# Patient Record
Sex: Female | Born: 2000 | Race: White | Hispanic: No | Marital: Single | Smoking: Never smoker
Health system: Southern US, Community
[De-identification: ages and names within clinical notes are randomized; demographics above are authoritative.]

## PROBLEM LIST (undated history)

## (undated) DIAGNOSIS — F32A Depression, unspecified: Secondary | ICD-10-CM

## (undated) DIAGNOSIS — F419 Anxiety disorder, unspecified: Secondary | ICD-10-CM

## (undated) DIAGNOSIS — Z9109 Other allergy status, other than to drugs and biological substances: Secondary | ICD-10-CM

## (undated) DIAGNOSIS — F309 Manic episode, unspecified: Secondary | ICD-10-CM

## (undated) DIAGNOSIS — F329 Major depressive disorder, single episode, unspecified: Secondary | ICD-10-CM

## (undated) HISTORY — DX: Anxiety disorder, unspecified: F41.9

## (undated) HISTORY — DX: Manic episode, unspecified: F30.9

## (undated) HISTORY — DX: Other allergy status, other than to drugs and biological substances: Z91.09

## (undated) HISTORY — DX: Major depressive disorder, single episode, unspecified: F32.9

## (undated) HISTORY — DX: Depression, unspecified: F32.A

---
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
## 2019-02-02 IMAGING — CT CT CERVICAL SPINE W/O CM
5 of 8 series · 12 of 33 positions shown, 13 images · non-contrast
Comparison: None.

CLINICAL DATA: Fall, hit back of head neck pain

EXAM:
CT HEAD WITHOUT CONTRAST
CT CERVICAL SPINE WITHOUT CONTRAST
TECHNIQUE: Multidetector CT imaging of the head and cervical spine was
performed following the standard protocol without intravenous
contrast. Multiplanar CT image reconstructions of the cervical spine
were also generated.

[Series 4: head bone · axial · 0.44mm/px · z∈[+104,+156]mm · 2 of 78 slices shown]
[im 26/78  bone]
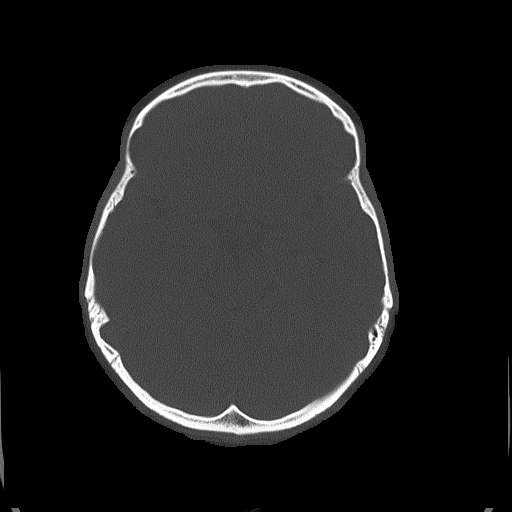
[im 52/78  bone]
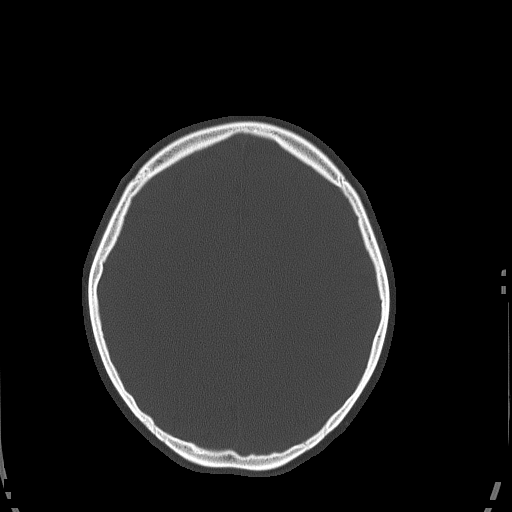

[Series 8: c spine soft · axial · 0.27mm/px · z∈[-44,+8]mm · 2 of 79 slices shown]
[im 27/79  soft-tissue]
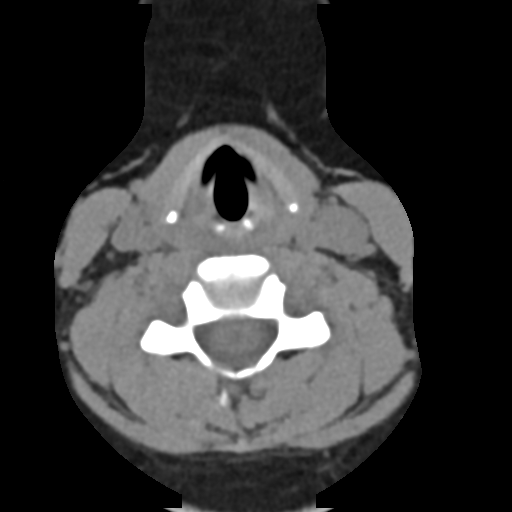
[im 53/79  soft-tissue]
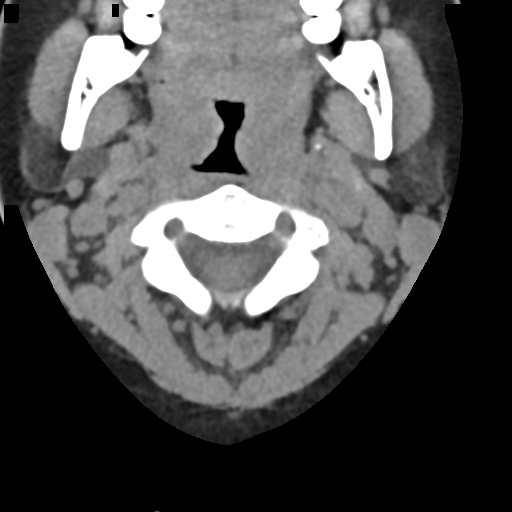

[Series 9: sagittal bone · sagittal · 0.23mm/px · 5 of 61 slices shown]
[im 11/61  bone]
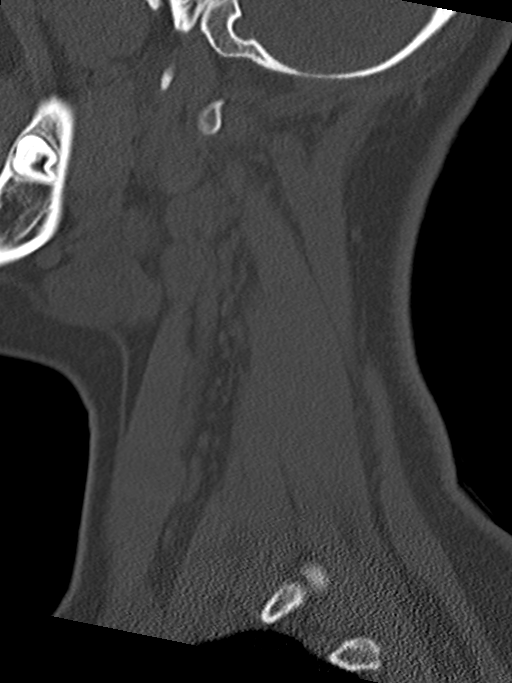
[im 21/61  bone]
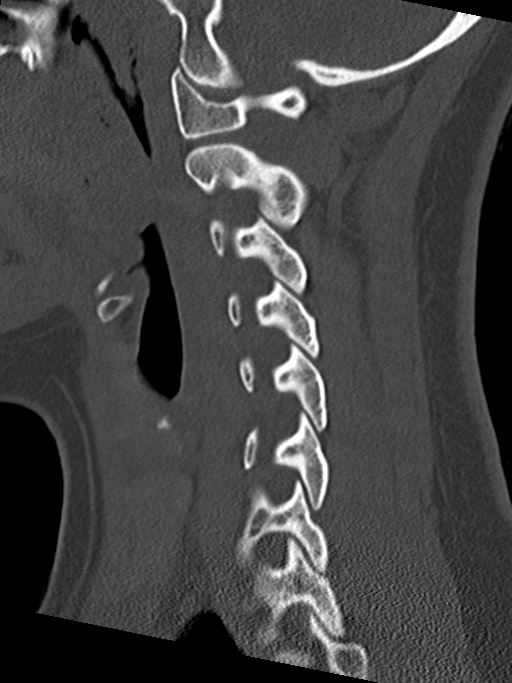
[im 31/61  bone]
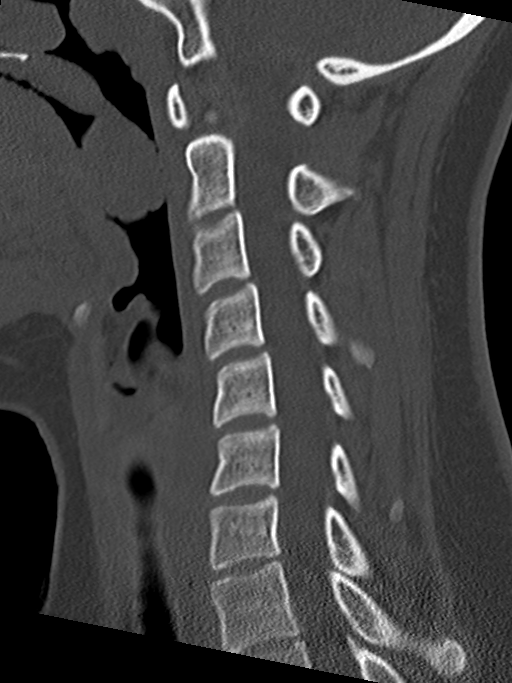
[im 41/61  bone]
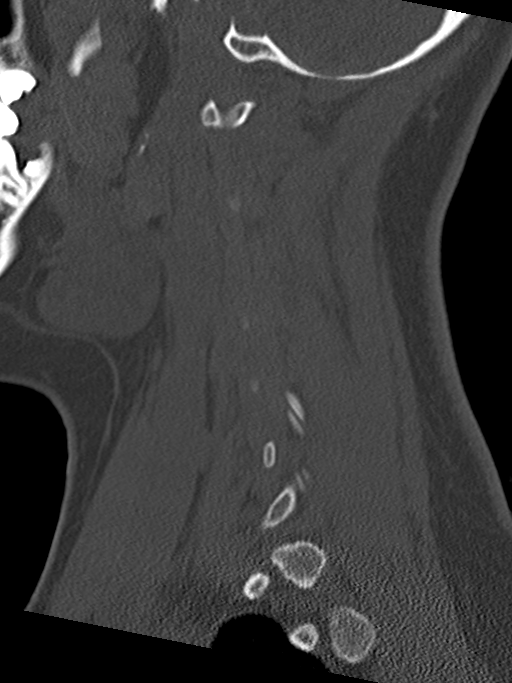
[im 51/61  bone]
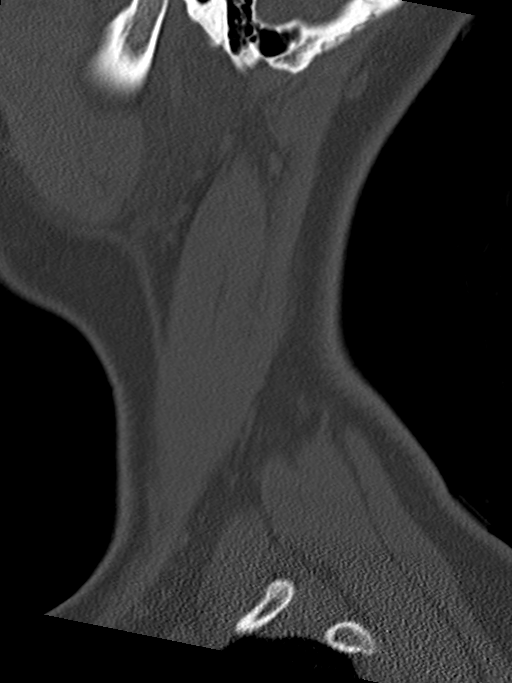

[Series 10: coronal bone · coronal · 0.23mm/px · 1 of 47 slices shown]
[im 24/47  bone]
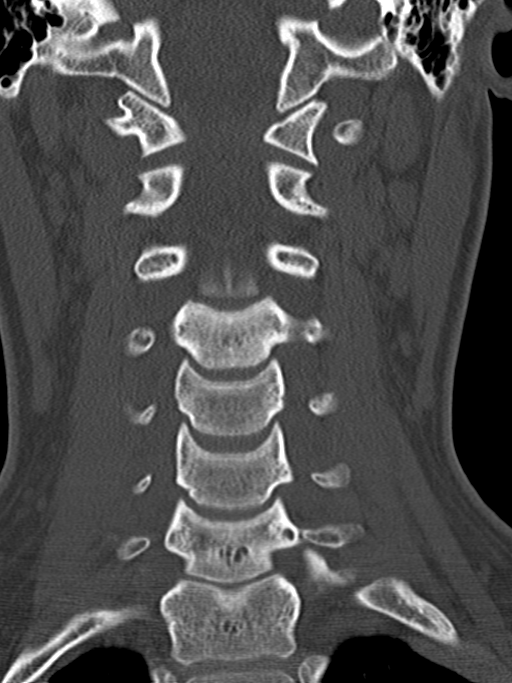

[Series 11: orthogonal bone · axial · 0.21mm/px · z∈[-61,-13]mm · 2 of 81 slices shown, 3 images]
[im 27/81  soft-tissue]
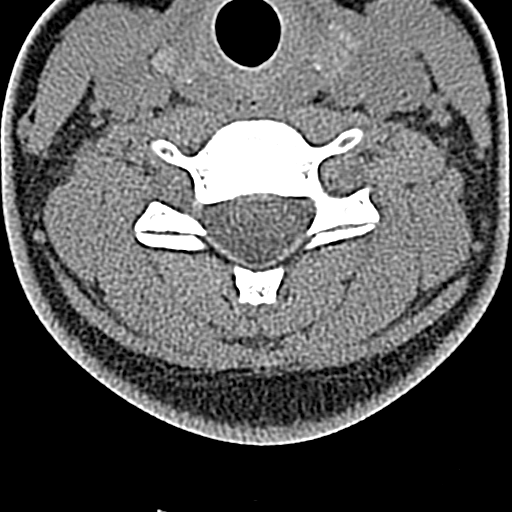
[im 27/81  bone]
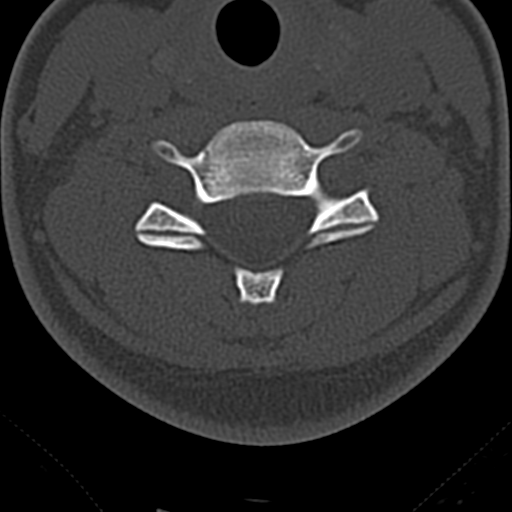
[im 54/81  bone]
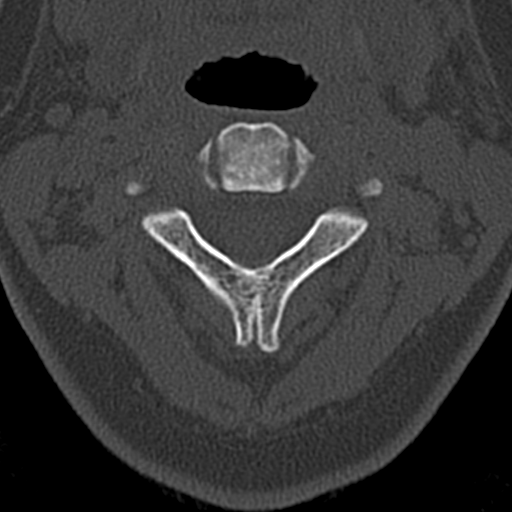

[12 of 33 positions shown; findings below may reference images not displayed]

FINDINGS: CT HEAD FINDINGS

Brain: No evidence of acute infarction, hemorrhage, hydrocephalus,
extra-axial collection or mass lesion/mass effect.

Vascular: No hyperdense vessel or unexpected calcification.

Skull: Normal. Negative for fracture or focal lesion.

Sinuses/Orbits: Mucosal thickening in the maxillary, sphenoid and
ethmoid sinuses. No acute orbital abnormality.

Other: None

CT CERVICAL SPINE FINDINGS

Alignment: Mild reversal of cervical lordosis. Facet alignment
within normal limits.

Skull base and vertebrae: No acute fracture. No primary bone lesion
or focal pathologic process.

Soft tissues and spinal canal: No prevertebral fluid or swelling. No
visible canal hematoma.

Disc levels: Disc spaces are maintained. Neural foramen are patent.

Upper chest: Negative.

Other: None
IMPRESSION: 1. No CT evidence for acute intracranial abnormality.
2. Mild reversal of cervical lordosis. No acute fracture or
malalignment.
